# Patient Record
Sex: Male | Born: 1940 | Race: White | Hispanic: No | Marital: Married | Smoking: Former smoker
Health system: Southern US, Community
[De-identification: ages and names within clinical notes are randomized; demographics above are authoritative.]

## PROBLEM LIST (undated history)

## (undated) DIAGNOSIS — G459 Transient cerebral ischemic attack, unspecified: Secondary | ICD-10-CM

## (undated) DIAGNOSIS — I10 Essential (primary) hypertension: Secondary | ICD-10-CM

## (undated) DIAGNOSIS — F329 Major depressive disorder, single episode, unspecified: Secondary | ICD-10-CM

## (undated) DIAGNOSIS — I2510 Atherosclerotic heart disease of native coronary artery without angina pectoris: Secondary | ICD-10-CM

## (undated) DIAGNOSIS — F32A Depression, unspecified: Secondary | ICD-10-CM

## (undated) DIAGNOSIS — J449 Chronic obstructive pulmonary disease, unspecified: Secondary | ICD-10-CM

## (undated) DIAGNOSIS — E785 Hyperlipidemia, unspecified: Secondary | ICD-10-CM

## (undated) HISTORY — DX: Major depressive disorder, single episode, unspecified: F32.9

## (undated) HISTORY — DX: Essential (primary) hypertension: I10

## (undated) HISTORY — DX: Chronic obstructive pulmonary disease, unspecified: J44.9

## (undated) HISTORY — DX: Atherosclerotic heart disease of native coronary artery without angina pectoris: I25.10

## (undated) HISTORY — DX: Hyperlipidemia, unspecified: E78.5

## (undated) HISTORY — DX: Depression, unspecified: F32.A

## (undated) HISTORY — PX: CHOLECYSTECTOMY: SHX55

## (undated) HISTORY — DX: Transient cerebral ischemic attack, unspecified: G45.9

---

## 2001-10-25 ENCOUNTER — Encounter: Admission: RE | Admit: 2001-10-25 | Discharge: 2001-10-25 | Payer: Self-pay | Admitting: Neurosurgery

## 2001-10-25 ENCOUNTER — Encounter: Payer: Self-pay | Admitting: Neurosurgery

## 2001-10-28 ENCOUNTER — Encounter: Payer: Self-pay | Admitting: Neurosurgery

## 2001-10-28 ENCOUNTER — Encounter: Admission: RE | Admit: 2001-10-28 | Discharge: 2001-10-28 | Payer: Self-pay | Admitting: Neurosurgery

## 2001-11-15 ENCOUNTER — Encounter: Payer: Self-pay | Admitting: Neurosurgery

## 2001-11-16 ENCOUNTER — Encounter: Payer: Self-pay | Admitting: Neurosurgery

## 2001-11-16 ENCOUNTER — Ambulatory Visit (HOSPITAL_COMMUNITY): Admission: RE | Admit: 2001-11-16 | Discharge: 2001-11-17 | Payer: Self-pay | Admitting: Neurosurgery

## 2001-12-09 ENCOUNTER — Encounter: Payer: Self-pay | Admitting: Neurosurgery

## 2001-12-09 ENCOUNTER — Ambulatory Visit (HOSPITAL_COMMUNITY): Admission: RE | Admit: 2001-12-09 | Discharge: 2001-12-09 | Payer: Self-pay | Admitting: Neurosurgery

## 2006-06-02 ENCOUNTER — Emergency Department: Payer: Self-pay | Admitting: Emergency Medicine

## 2006-06-02 ENCOUNTER — Other Ambulatory Visit: Payer: Self-pay

## 2008-05-08 ENCOUNTER — Encounter: Payer: Self-pay | Admitting: Family Medicine

## 2009-12-08 DIAGNOSIS — G459 Transient cerebral ischemic attack, unspecified: Secondary | ICD-10-CM

## 2009-12-08 HISTORY — DX: Transient cerebral ischemic attack, unspecified: G45.9

## 2010-05-10 ENCOUNTER — Emergency Department: Payer: Self-pay | Admitting: Emergency Medicine

## 2011-02-17 ENCOUNTER — Emergency Department: Payer: Self-pay | Admitting: Emergency Medicine

## 2011-03-12 ENCOUNTER — Ambulatory Visit: Payer: Self-pay | Admitting: Pain Medicine

## 2011-04-02 ENCOUNTER — Ambulatory Visit: Payer: Self-pay | Admitting: Pain Medicine

## 2011-04-17 ENCOUNTER — Ambulatory Visit: Payer: Self-pay | Admitting: Pain Medicine

## 2011-11-15 ENCOUNTER — Inpatient Hospital Stay: Payer: Self-pay | Admitting: Internal Medicine

## 2013-03-26 IMAGING — CT CT CHEST W/O CM
1 series · 15 of 31 positions shown, 19 images · non-contrast
Comparison: none

REASON FOR EXAM: dyspnea  chest pain
COMMENTS:

[Series 2: soft tissue · axial · 0.73mm/px · z∈[-192,+108]mm · 15 of 66 slices shown, 19 images]
[im 3/66  mediastinal]
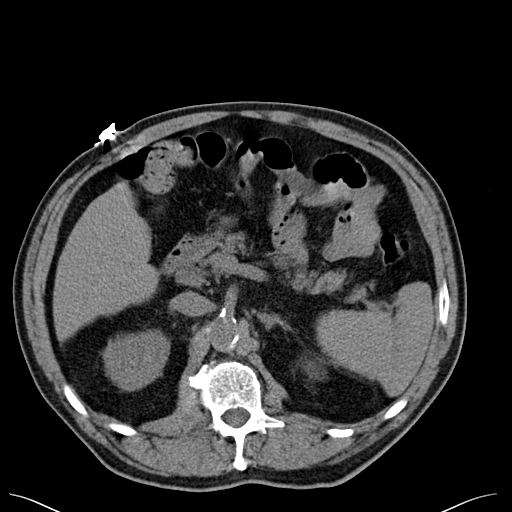
[im 3/66  lung]
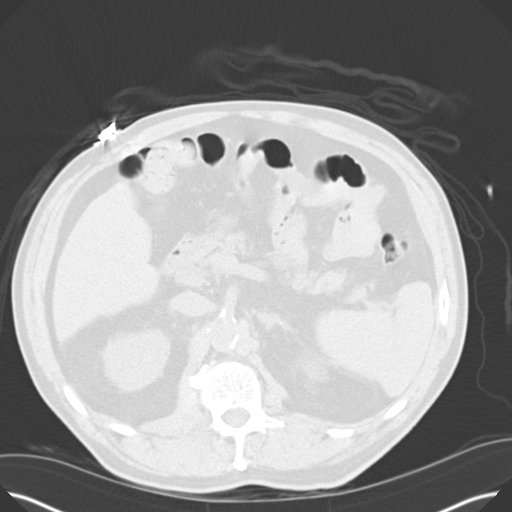
[im 8/66  lung]
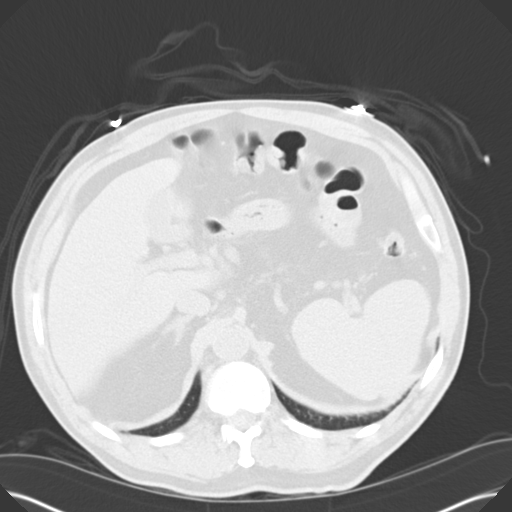
[im 13/66  lung]
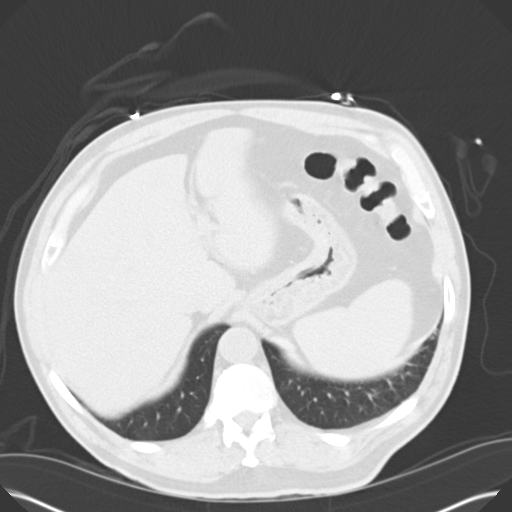
[im 15/66  lung]
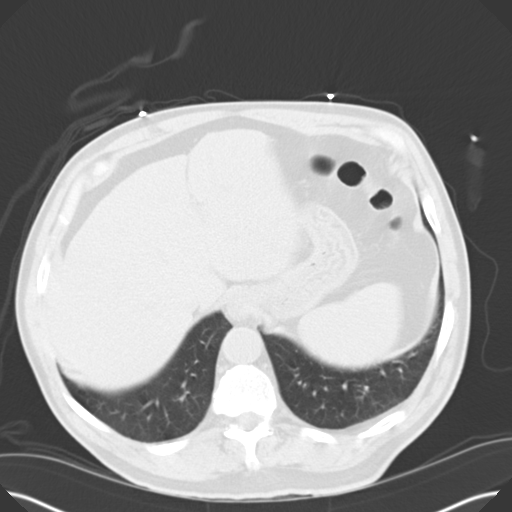
[im 20/66  mediastinal]
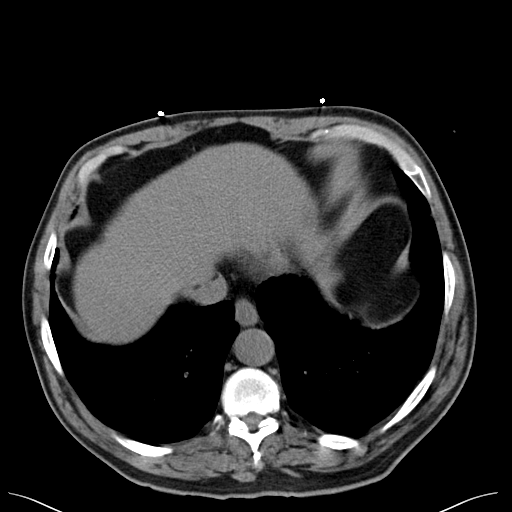
[im 20/66  lung]
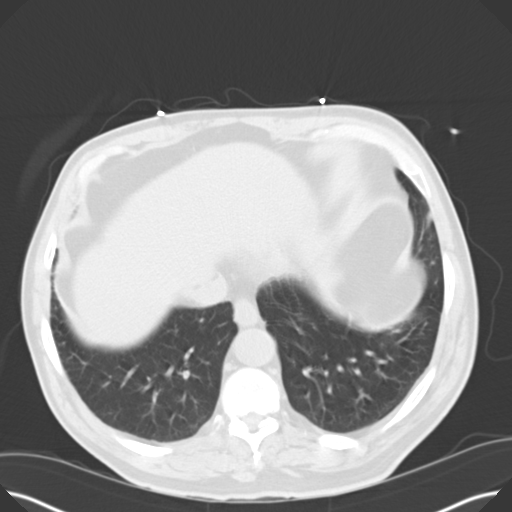
[im 25/66  lung]
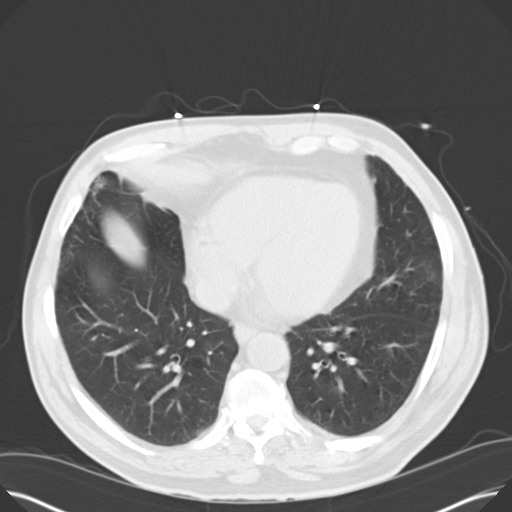
[im 29/66  lung]
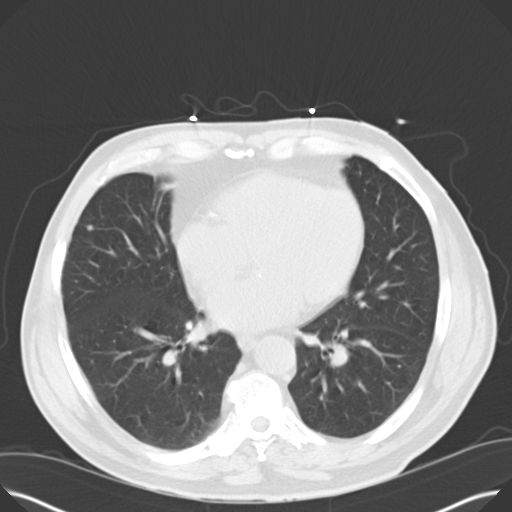
[im 34/66  lung]
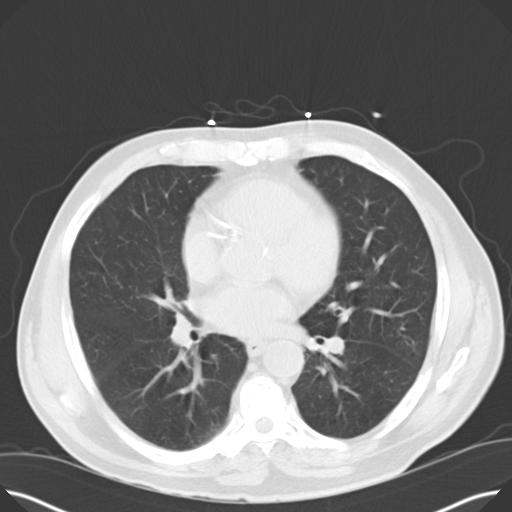
[im 37/66  mediastinal]
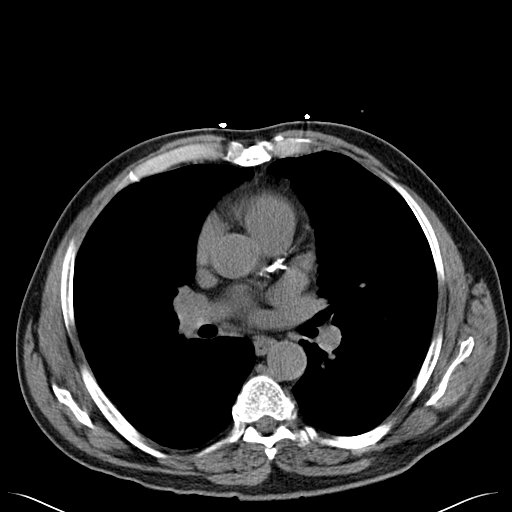
[im 37/66  lung]
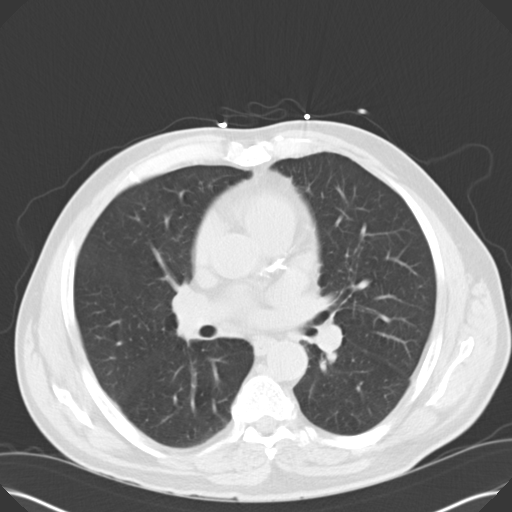
[im 40/66  lung]
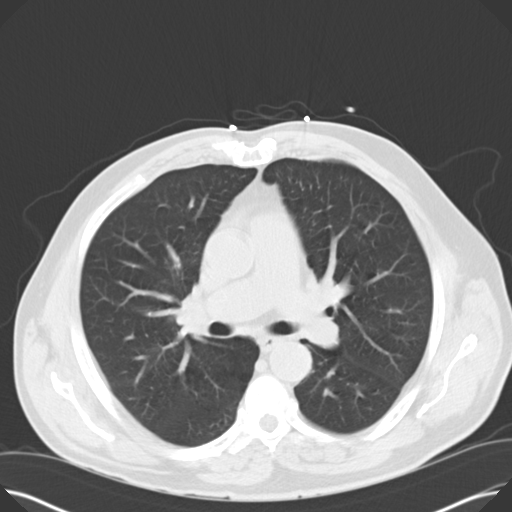
[im 44/66  lung]
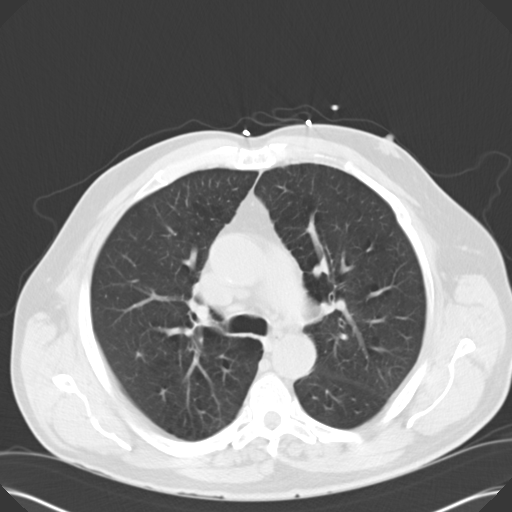
[im 49/66  lung]
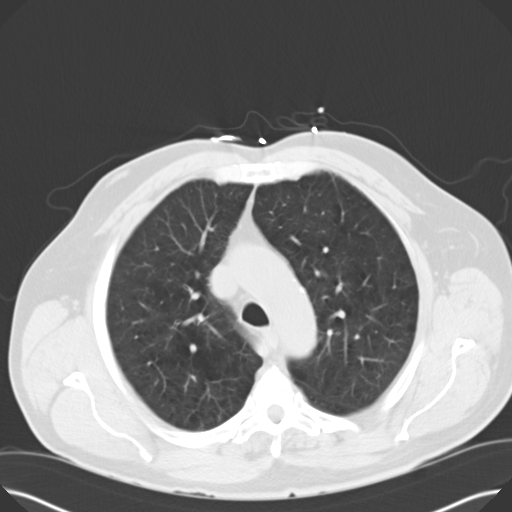
[im 53/66  mediastinal]
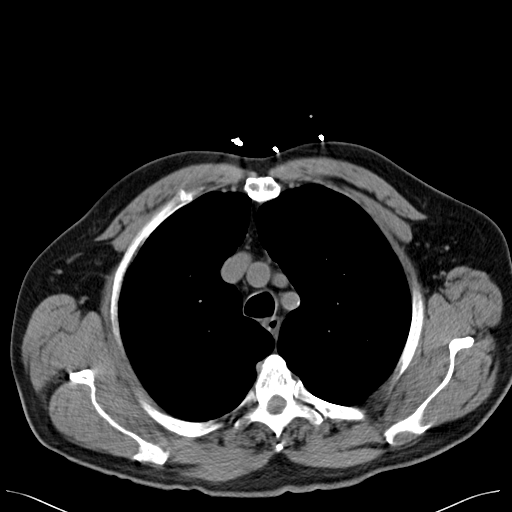
[im 53/66  lung]
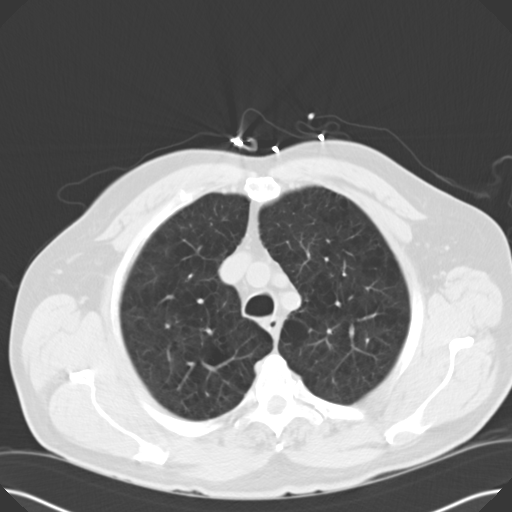
[im 58/66  lung]
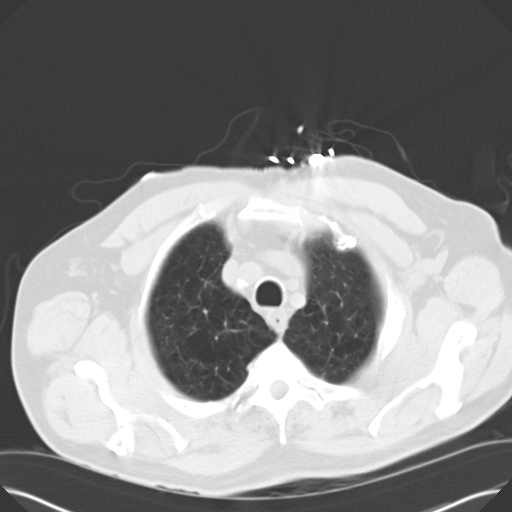
[im 63/66  lung]
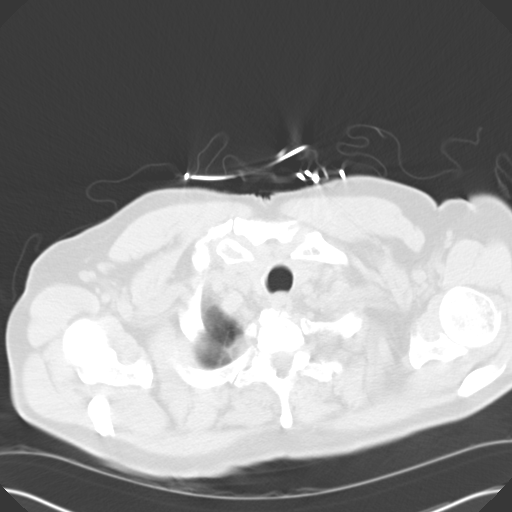

[15 of 31 positions shown; findings below may reference images not displayed]

PROCEDURE:     CT  - CT CHEST WITHOUT CONTRAST  - November 15, 2011  [DATE]

RESULT:     Axial noncontrast CT scanning was performed through the chest
with reconstructions at 5 mm intervals and slice thicknesses. Review of
multiplanar reconstructed images was performed separately on the VIA monitor.

The CAD images exhibit emphysematous changes bilaterally. I do not see
evidence of pneumonia. There are scattered nodules in both lungs. The
largest measures 4 mm in diameter is seen on image 38 in the anterolateral
aspect of the right middle lobe. The cardiac chambers are normal in size.
The caliber of the thoracic aorta is normal. I see no pathologic sized or
hilar lymph nodes. There is no pleural nor pericardial effusion. Within the
upper abdomen the observed portions of the liver and spleen and adrenal
glands are normal. The thoracic vertebral bodies are preserved in height.
IMPRESSION: 1. There are emphysematous changes in both lungs. There are numerous tiny
nodules which are nonspecific but which will merit followup. There is no
evidence of pneumonia.
2. I see no mediastinal nor hilar lymphadenopathy.

A preliminary report was sent to the [HOSPITAL] the conclusion
of the study.

## 2013-03-27 IMAGING — US ABDOMEN ULTRASOUND
1 series · 18 of 25 positions shown · non-contrast
Comparison: none

REASON FOR EXAM: Neutropenia/Thrombocytopenia
COMMENTS:

PROCEDURE:     US  - US ABDOMEN GENERAL SURVEY  - November 16, 2011 [DATE]
RESULT:     Liver normal. Pancreas normal where visualized. Gallbladder
normal. Gallbladder wall thickness 2.1 mm. Common bile duct diameter 3 mm.
No hydronephrosis. No splenomegaly.

[Series 1: abdomen ultrasound · 18 of 86 slices shown]
[im 1/86]
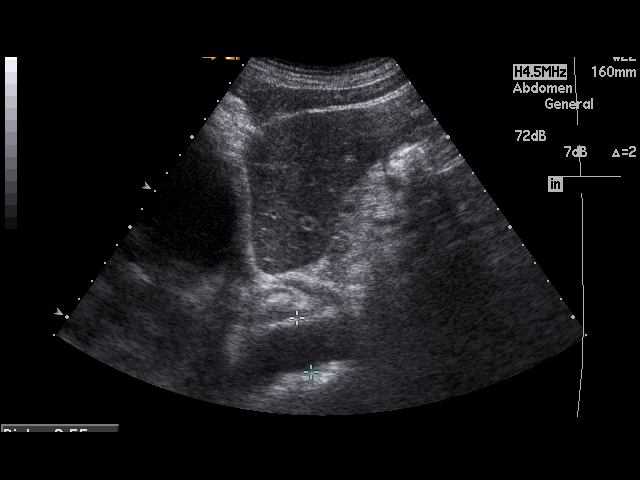
[im 8/86]
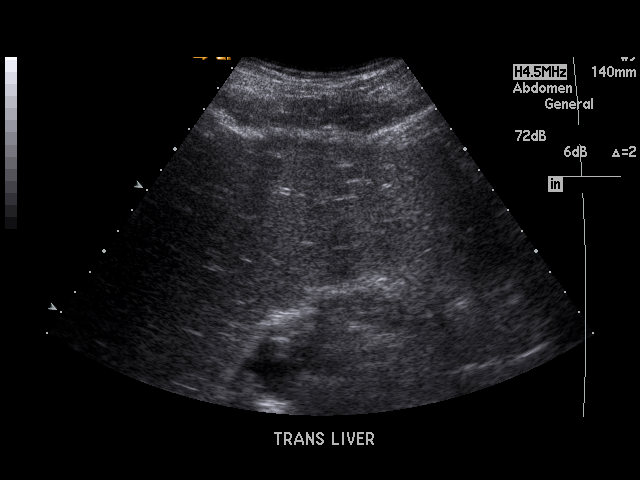
[im 11/86]
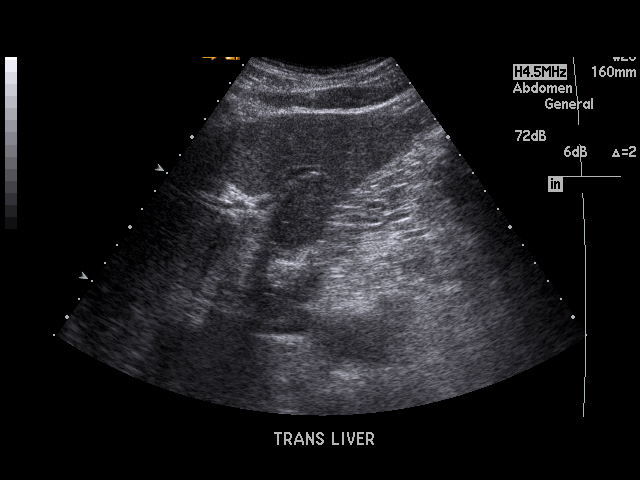
[im 15/86]
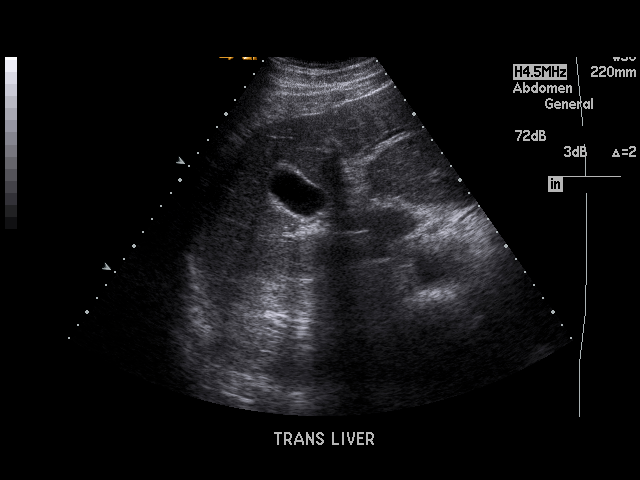
[im 22/86]
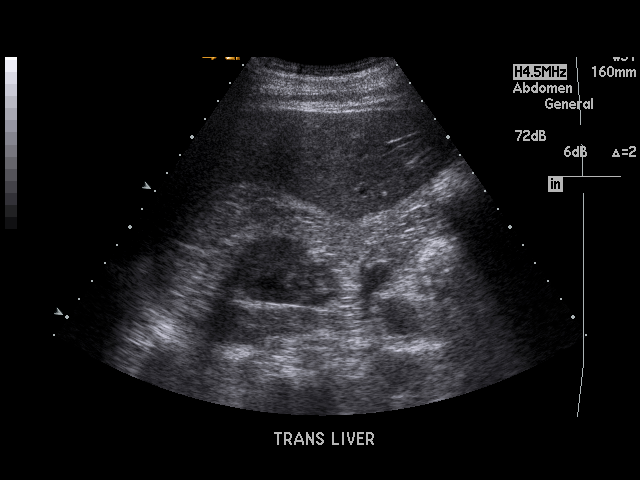
[im 25/86]
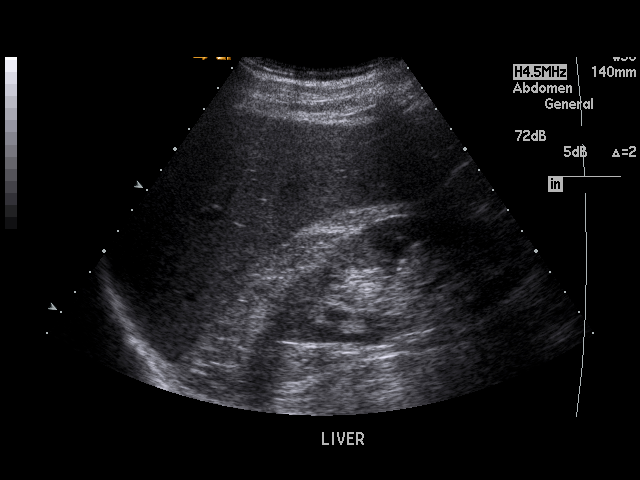
[im 32/86]
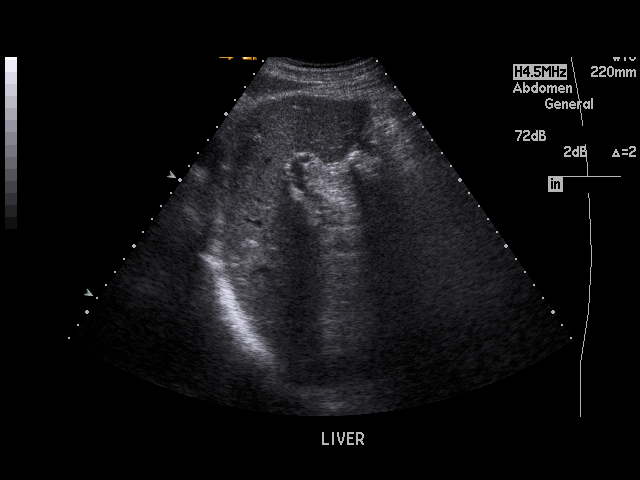
[im 36/86]
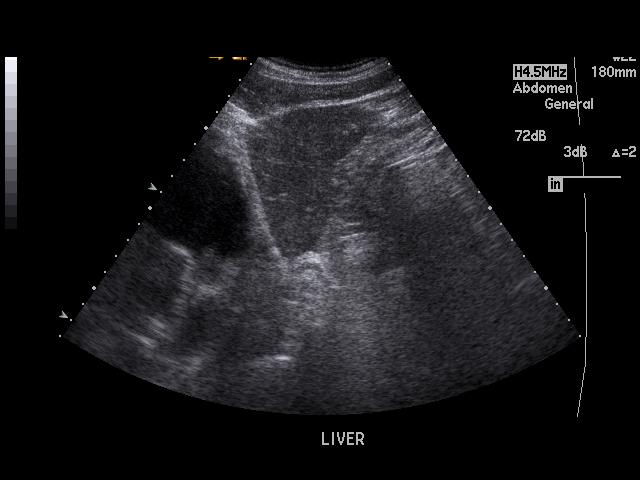
[im 39/86]
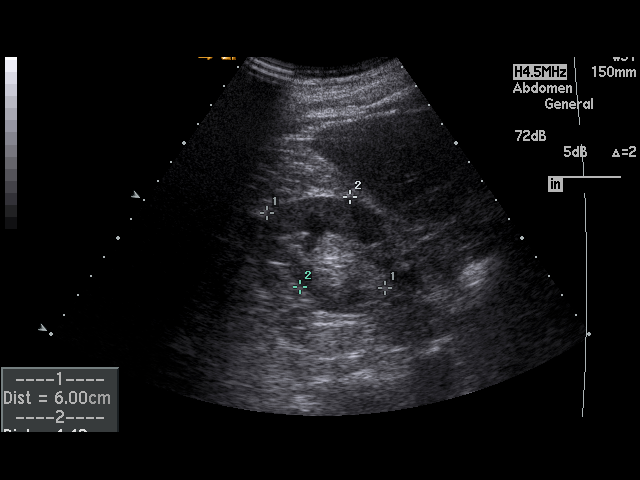
[im 47/86]
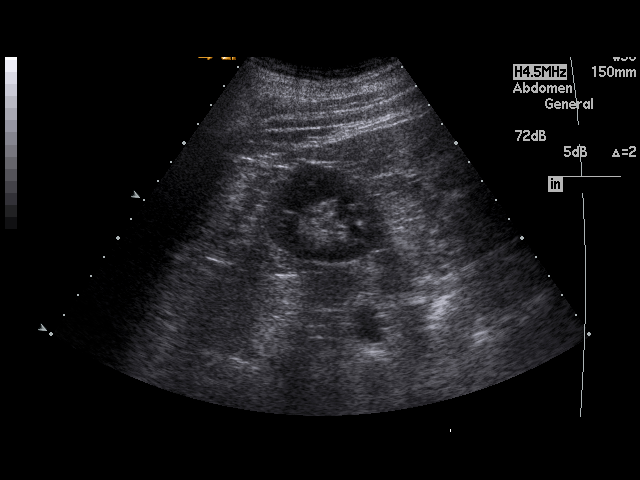
[im 50/86]
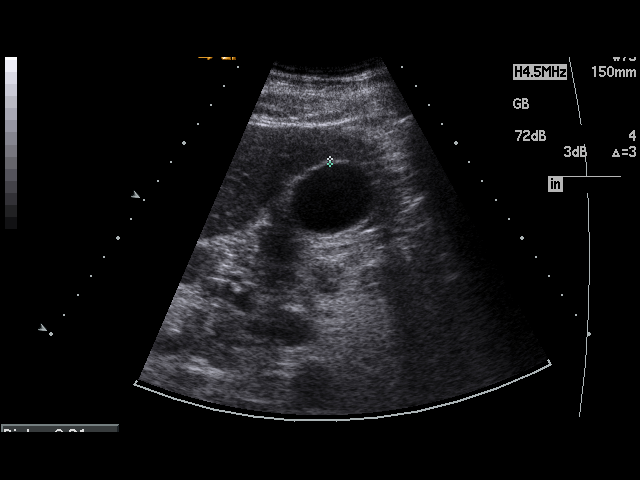
[im 54/86]
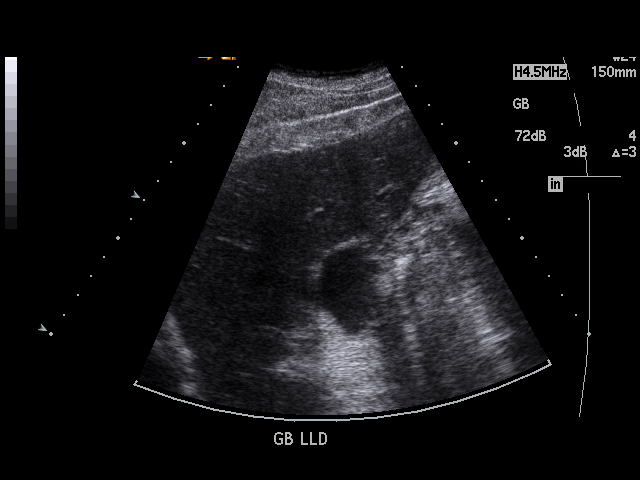
[im 61/86]
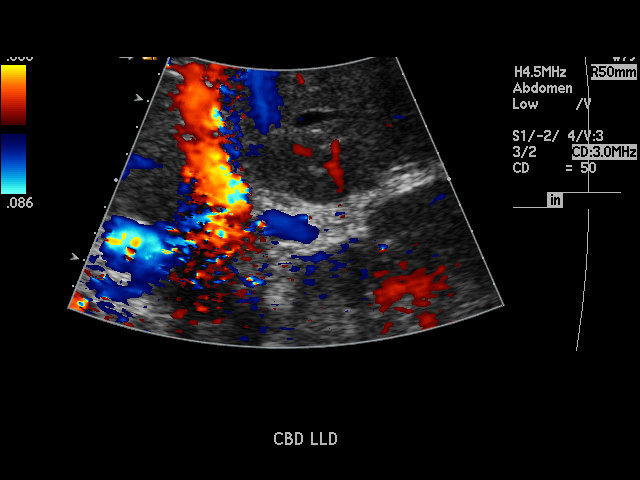
[im 64/86]
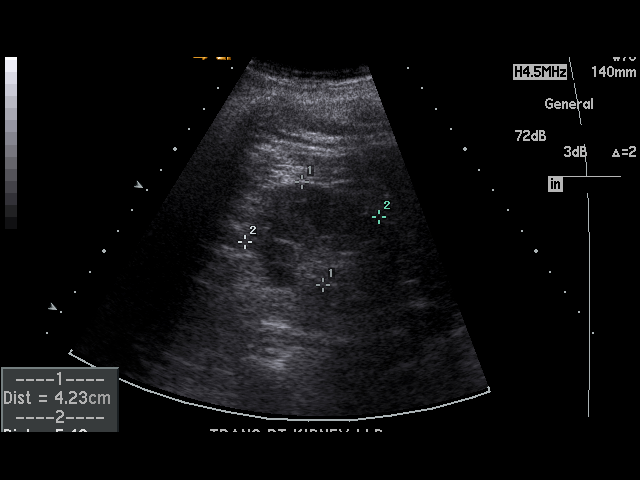
[im 71/86]
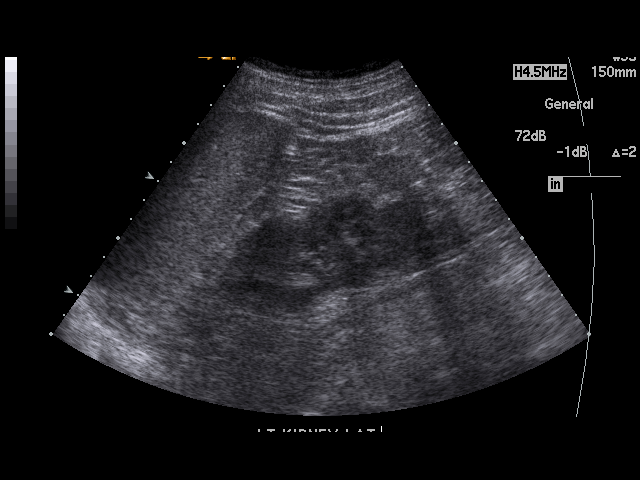
[im 75/86]
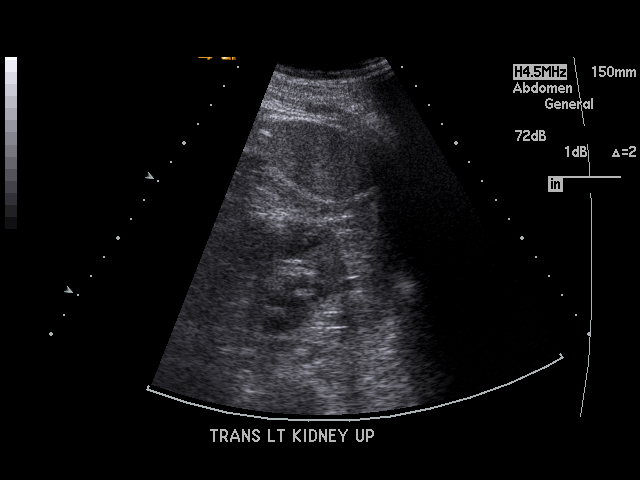
[im 78/86]
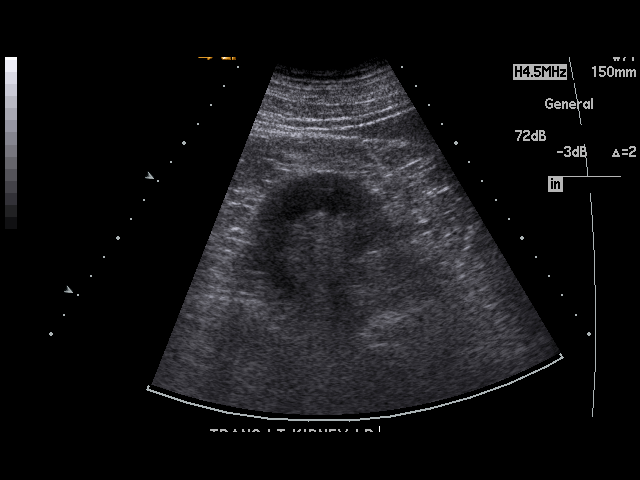
[im 86/86]
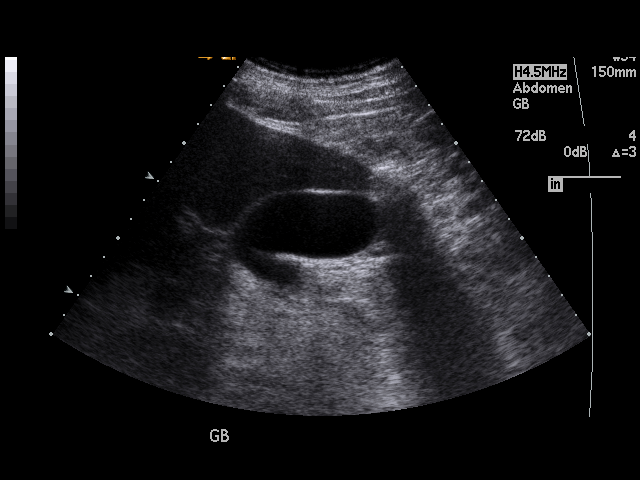

[18 of 25 positions shown; findings below may reference images not displayed]

IMPRESSION: Normal exam.

## 2015-05-22 ENCOUNTER — Encounter (INDEPENDENT_AMBULATORY_CARE_PROVIDER_SITE_OTHER): Payer: Self-pay | Admitting: Ophthalmology

## 2015-05-23 ENCOUNTER — Encounter (INDEPENDENT_AMBULATORY_CARE_PROVIDER_SITE_OTHER): Payer: Medicare Other | Admitting: Ophthalmology

## 2015-05-23 DIAGNOSIS — I10 Essential (primary) hypertension: Secondary | ICD-10-CM

## 2015-05-23 DIAGNOSIS — H43813 Vitreous degeneration, bilateral: Secondary | ICD-10-CM

## 2015-05-23 DIAGNOSIS — H3531 Nonexudative age-related macular degeneration: Secondary | ICD-10-CM | POA: Diagnosis not present

## 2015-05-23 DIAGNOSIS — H35033 Hypertensive retinopathy, bilateral: Secondary | ICD-10-CM

## 2015-05-27 ENCOUNTER — Other Ambulatory Visit: Payer: Self-pay | Admitting: Family Medicine

## 2015-05-28 ENCOUNTER — Telehealth: Payer: Self-pay | Admitting: Family Medicine

## 2015-05-28 NOTE — Telephone Encounter (Signed)
MAC has written Rx, faxed to [facility]

## 2015-05-28 NOTE — Telephone Encounter (Signed)
Pt called stated he needs on Lorazepam. Pharm is [facility]. Thanks.

## 2015-06-25 ENCOUNTER — Encounter: Payer: Self-pay | Admitting: Family Medicine

## 2015-06-25 ENCOUNTER — Ambulatory Visit (INDEPENDENT_AMBULATORY_CARE_PROVIDER_SITE_OTHER): Payer: Medicare Other | Admitting: Family Medicine

## 2015-06-25 VITALS — BP 148/63 | HR 50 | Temp 98.0°F | Ht 68.0 in | Wt 165.0 lb

## 2015-06-25 DIAGNOSIS — I10 Essential (primary) hypertension: Secondary | ICD-10-CM | POA: Diagnosis not present

## 2015-06-25 DIAGNOSIS — I2510 Atherosclerotic heart disease of native coronary artery without angina pectoris: Secondary | ICD-10-CM | POA: Diagnosis not present

## 2015-06-25 DIAGNOSIS — E785 Hyperlipidemia, unspecified: Secondary | ICD-10-CM

## 2015-06-25 DIAGNOSIS — Z0000 Encounter for general adult medical examination without abnormal findings: Secondary | ICD-10-CM

## 2015-06-25 DIAGNOSIS — M1712 Unilateral primary osteoarthritis, left knee: Secondary | ICD-10-CM | POA: Diagnosis not present

## 2015-06-25 DIAGNOSIS — F329 Major depressive disorder, single episode, unspecified: Secondary | ICD-10-CM

## 2015-06-25 DIAGNOSIS — I2583 Coronary atherosclerosis due to lipid rich plaque: Secondary | ICD-10-CM

## 2015-06-25 DIAGNOSIS — Z125 Encounter for screening for malignant neoplasm of prostate: Secondary | ICD-10-CM

## 2015-06-25 DIAGNOSIS — F32A Depression, unspecified: Secondary | ICD-10-CM

## 2015-06-25 LAB — URINALYSIS, ROUTINE W REFLEX MICROSCOPIC
Bilirubin, UA: NEGATIVE
Glucose, UA: NEGATIVE
KETONES UA: NEGATIVE
Leukocytes, UA: NEGATIVE
NITRITE UA: NEGATIVE
RBC UA: NEGATIVE
Specific Gravity, UA: 1.025 (ref 1.005–1.030)
Urobilinogen, Ur: 0.2 mg/dL (ref 0.2–1.0)
pH, UA: 5 (ref 5.0–7.5)

## 2015-06-25 MED ORDER — TRAZODONE HCL 50 MG PO TABS: 50.0000 mg | ORAL_TABLET | Freq: Every day | ORAL | Status: DC

## 2015-06-25 MED ORDER — LORAZEPAM 1 MG PO TABS: 1.0000 mg | ORAL_TABLET | Freq: Every day | ORAL | Status: DC | PRN

## 2015-06-25 MED ORDER — CITALOPRAM HYDROBROMIDE 40 MG PO TABS: 40.0000 mg | ORAL_TABLET | Freq: Every day | ORAL | Status: DC

## 2015-06-25 MED ORDER — SIMVASTATIN 80 MG PO TABS: 80.0000 mg | ORAL_TABLET | Freq: Every day | ORAL | Status: DC

## 2015-06-25 NOTE — Progress Notes (Signed)
BP 148/63 mmHg  Pulse 50  Temp(Src) 98 F (36.7 C)  Ht 5\' 8"  (1.727 m)  Wt 165 lb (74.844 kg)  BMI 25.09 kg/m2  SpO2 95%   Subjective:    Patient ID: Andre JacksJimmy L Goosby, male    DOB: Aug 16, 1941, 74 y.o.   MRN: [id number]  HPI: Andre Griffin is a 74 y.o. male  Chief Complaint  Patient presents with  . Annual Exam   patient taken citalopram and lorazepam at night for nerves which is doing well. Does feel tired though but no hangover no falling Does well with simvastatin for cholesterol with no side effects takes everyday Takes trazodone for sleep and does well Was on Advil for his knee pain along with tramadol kidney doctor stopped nonsteroidal anti-inflammatory medicines and now uses tramadol for knee pain.  Relevant past medical, surgical, family and social history reviewed and updated as indicated. Interim medical history since our last visit reviewed. Allergies and medications reviewed and updated.  Review of Systems  Constitutional: Negative.   HENT: Negative.   Eyes: Negative.   Respiratory: Negative.   Cardiovascular: Negative.   Endocrine: Negative.   Musculoskeletal: Negative.   Skin: Negative.   Allergic/Immunologic: Negative.   Neurological: Negative.   Hematological: Negative.   Psychiatric/Behavioral: Negative.     Per HPI unless specifically indicated above     Objective:    BP 148/63 mmHg  Pulse 50  Temp(Src) 98 F (36.7 C)  Ht 5\' 8"  (1.727 m)  Wt 165 lb (74.844 kg)  BMI 25.09 kg/m2  SpO2 95%  Wt Readings from Last 3 Encounters:  06/25/15 165 lb (74.844 kg)  12/21/14 167 lb (75.751 kg)    Physical Exam  Constitutional: He is oriented to person, place, and time. He appears well-developed and well-nourished.  HENT:  Head: Normocephalic and atraumatic.  Right Ear: External ear normal.  Left Ear: External ear normal.  Eyes: Conjunctivae and EOM are normal. Pupils are equal, round, and reactive to light.  Neck: Normal range of motion. Neck  supple.  Cardiovascular: Normal rate, regular rhythm, normal heart sounds and intact distal pulses.   Pulmonary/Chest: Effort normal and breath sounds normal.  Abdominal: Soft. Bowel sounds are normal. There is no splenomegaly or hepatomegaly.  Genitourinary: Rectum normal, prostate normal and penis normal.  Musculoskeletal: Normal range of motion.  Neurological: He is alert and oriented to person, place, and time. He has normal reflexes.  Skin: No rash noted. No erythema.  Psychiatric: He has a normal mood and affect. His behavior is normal. Judgment and thought content normal.    No results found for this or any previous visit.    Assessment & Plan:   Problem List Items Addressed This Visit      Cardiovascular and Mediastinum   Hypertension - Primary   Relevant Medications   simvastatin (ZOCOR) 80 MG tablet   Other Relevant Orders   Comprehensive metabolic panel   CBC with Differential/Platelet   Urinalysis, Routine w reflex microscopic ([facility])   TSH   PSA   Lipid panel   CAD (coronary artery disease)   Relevant Medications   simvastatin (ZOCOR) 80 MG tablet   Other Relevant Orders   Comprehensive metabolic panel   CBC with Differential/Platelet   Urinalysis, Routine w reflex microscopic ([facility])   TSH   PSA   Lipid panel     Musculoskeletal and Integument   Left knee DJD   Relevant Orders   Comprehensive metabolic  panel   CBC with Differential/Platelet   Urinalysis, Routine w reflex microscopic (not at [facility])   TSH   PSA   Lipid panel     Other   Depression   Relevant Medications   traZODone (DESYREL) 50 MG tablet   LORazepam (ATIVAN) 1 MG tablet   citalopram (CELEXA) 40 MG tablet   Other Relevant Orders   Comprehensive metabolic panel   CBC with Differential/Platelet   Urinalysis, Routine w reflex microscopic (not at [facility])   TSH   PSA   Lipid panel   Hyperlipidemia   Relevant Medications   simvastatin (ZOCOR) 80 MG tablet   Other Relevant  Orders   Comprehensive metabolic panel   CBC with Differential/Platelet   Urinalysis, Routine w reflex microscopic (not at [facility])   TSH   PSA   Lipid panel    Other Visit Diagnoses    PE (physical exam), annual        Relevant Orders    Comprehensive metabolic panel    CBC with Differential/Platelet    Urinalysis, Routine w reflex microscopic (not at [facility])    TSH    PSA    Lipid panel        Follow up plan: Return in about 6 months (around 12/26/2015), or if symptoms worsen or fail to improve, for recheck lipids, alt, ast,bmp.

## 2015-06-26 LAB — COMPREHENSIVE METABOLIC PANEL
ALBUMIN: 4 g/dL (ref 3.5–4.8)
ALT: 17 IU/L (ref 0–44)
AST: 20 IU/L (ref 0–40)
Albumin/Globulin Ratio: 1.4 (ref 1.1–2.5)
Alkaline Phosphatase: 80 IU/L (ref 39–117)
BILIRUBIN TOTAL: 0.3 mg/dL (ref 0.0–1.2)
BUN/Creatinine Ratio: 9 — ABNORMAL LOW (ref 10–22)
BUN: 13 mg/dL (ref 8–27)
CO2: 23 mmol/L (ref 18–29)
Calcium: 8.8 mg/dL (ref 8.6–10.2)
Chloride: 105 mmol/L (ref 97–108)
Creatinine, Ser: 1.49 mg/dL — ABNORMAL HIGH (ref 0.76–1.27)
GFR calc non Af Amer: 46 mL/min/{1.73_m2} — ABNORMAL LOW (ref >59)
GFR, EST AFRICAN AMERICAN: 53 mL/min/{1.73_m2} — AB (ref >59)
GLOBULIN, TOTAL: 2.9 g/dL (ref 1.5–4.5)
Glucose: 83 mg/dL (ref 65–99)
Potassium: 3.9 mmol/L (ref 3.5–5.2)
Sodium: 145 mmol/L — ABNORMAL HIGH (ref 134–144)
Total Protein: 6.9 g/dL (ref 6.0–8.5)

## 2015-06-26 LAB — CBC WITH DIFFERENTIAL/PLATELET
Basophils Absolute: 0 10*3/uL (ref 0.0–0.2)
Basos: 0 %
EOS (ABSOLUTE): 0.2 10*3/uL (ref 0.0–0.4)
Eos: 2 %
HEMATOCRIT: 40.2 % (ref 37.5–51.0)
HEMOGLOBIN: 13.5 g/dL (ref 12.6–17.7)
Immature Grans (Abs): 0 10*3/uL (ref 0.0–0.1)
Immature Granulocytes: 0 %
Lymphocytes Absolute: 1.6 10*3/uL (ref 0.7–3.1)
Lymphs: 23 %
MCH: 31.1 pg (ref 26.6–33.0)
MCHC: 33.6 g/dL (ref 31.5–35.7)
MCV: 93 fL (ref 79–97)
MONOCYTES: 12 %
MONOS ABS: 0.8 10*3/uL (ref 0.1–0.9)
Neutrophils Absolute: 4.3 10*3/uL (ref 1.4–7.0)
Neutrophils: 63 %
Platelets: 168 10*3/uL (ref 150–379)
RBC: 4.34 x10E6/uL (ref 4.14–5.80)
RDW: 14.5 % (ref 12.3–15.4)
WBC: 6.9 10*3/uL (ref 3.4–10.8)

## 2015-06-26 LAB — LIPID PANEL
Chol/HDL Ratio: 5.2 ratio units — ABNORMAL HIGH (ref 0.0–5.0)
Cholesterol, Total: 161 mg/dL (ref 100–199)
HDL: 31 mg/dL — ABNORMAL LOW (ref >39)
LDL Calculated: 98 mg/dL (ref 0–99)
Triglycerides: 159 mg/dL — ABNORMAL HIGH (ref 0–149)
VLDL CHOLESTEROL CAL: 32 mg/dL (ref 5–40)

## 2015-06-26 LAB — PSA: Prostate Specific Ag, Serum: 2.7 ng/mL (ref 0.0–4.0)

## 2015-06-26 LAB — TSH: TSH: 1.78 u[IU]/mL (ref 0.450–4.500)

## 2015-06-27 ENCOUNTER — Other Ambulatory Visit: Payer: Self-pay | Admitting: Family Medicine

## 2015-06-28 NOTE — Telephone Encounter (Signed)
Called into [address] 

## 2015-07-24 ENCOUNTER — Telehealth: Payer: Self-pay

## 2015-07-24 ENCOUNTER — Telehealth: Payer: Self-pay | Admitting: Family Medicine

## 2015-07-24 DIAGNOSIS — F329 Major depressive disorder, single episode, unspecified: Secondary | ICD-10-CM

## 2015-07-24 DIAGNOSIS — F32A Depression, unspecified: Secondary | ICD-10-CM

## 2015-07-24 MED ORDER — LORAZEPAM 1 MG PO TABS: 1.0000 mg | ORAL_TABLET | Freq: Every day | ORAL | Status: DC | PRN

## 2015-07-24 NOTE — Telephone Encounter (Signed)
Lorazepam should be written BID - per MAC Change given to [facility]

## 2015-07-24 NOTE — Telephone Encounter (Signed)
Lorazepam has historically been written BID #60 He is running out, could you send new Rx

## 2015-07-24 NOTE — Telephone Encounter (Signed)
Paige from [place] called has questions about pt's Lorazepam. Please call Paige. Thanks.

## 2015-08-23 ENCOUNTER — Encounter: Payer: Self-pay | Admitting: Family Medicine

## 2015-08-23 ENCOUNTER — Ambulatory Visit (INDEPENDENT_AMBULATORY_CARE_PROVIDER_SITE_OTHER): Payer: Medicare Other | Admitting: Family Medicine

## 2015-08-23 VITALS — BP 164/95 | HR 52 | Temp 97.1°F | Wt 170.0 lb

## 2015-08-23 DIAGNOSIS — R42 Dizziness and giddiness: Secondary | ICD-10-CM

## 2015-08-23 DIAGNOSIS — R001 Bradycardia, unspecified: Secondary | ICD-10-CM

## 2015-08-23 DIAGNOSIS — Z23 Encounter for immunization: Secondary | ICD-10-CM | POA: Diagnosis not present

## 2015-08-23 NOTE — Progress Notes (Signed)
BP 164/95 mmHg  Pulse 52  Temp(Src) 97.1 F (36.2 C)  Wt 170 lb (77.111 kg)  SpO2 93%   Subjective:    Patient ID: Andre Griffin, male    DOB: [birth year], 74 y.o.   MRN: [id number]  HPI: Andre Griffin is a 74 y.o. male  Chief Complaint  Patient presents with  . Numbness    left arm numbness, swimmy headed and he thinks that he has blacked out a couple times. Patient states that the only other symptom he is having is a lot of gas.   Has a history of CAD.  Started prior to Tuesday, has been passing out, and having L arm numbness. No chest pain, no SOB, has been feeling dizzy. Recently had his blood pressure medicine stopped by his nephrologist.  DIZZINESS Duration: days Description of symptoms: lightheaded Duration of episode: minutes Dizziness frequency: recurrent Provoking factors: sitting up Aggravating factors:  Moving around Triggered by rolling over in bed: no Triggered by bending over: yes Aggravated by head movement: no Aggravated by exertion, coughing, loud noises: no Recent head injury: no Recent or current viral symptoms: no History of vasovagal episodes: no Nausea: yes Vomiting: no Tinnitus: no Hearing loss: no Aural fullness: no Headache: no Photophobia/phonophobia: no Unsteady gait: yes Postural instability: yes Diplopia, dysarthria, dysphagia or weakness: no Related to exertion: no Pallor: yes Diaphoresis: no Dyspnea: no Chest pain: no  Relevant past medical, surgical, family and social history reviewed and updated as indicated. Interim medical history since our last visit reviewed. Allergies and medications reviewed and updated.  Review of Systems  Constitutional: Positive for fatigue. Negative for fever, chills, diaphoresis, activity change, appetite change and unexpected weight change.  Respiratory: Negative.   Cardiovascular: Negative.   Neurological: Positive for dizziness, syncope, weakness, light-headedness and numbness. Negative for  tremors, seizures, facial asymmetry, speech difficulty and headaches.    Per HPI unless specifically indicated above     Objective:    BP 164/95 mmHg  Pulse 52  Temp(Src) 97.1 F (36.2 C)  Wt 170 lb (77.111 kg)  SpO2 93%  Wt Readings from Last 3 Encounters:  08/23/15 170 lb (77.111 kg)  06/25/15 165 lb (74.844 kg)  12/21/14 167 lb (75.751 kg)    Physical Exam  Constitutional: He is oriented to person, place, and time. He appears well-developed and well-nourished. No distress.  HENT:  Head: Normocephalic and atraumatic.  Right Ear: Hearing normal.  Left Ear: Hearing normal.  Nose: Nose normal.  Eyes: Conjunctivae and lids are normal. Right eye exhibits no discharge. Left eye exhibits no discharge. No scleral icterus.  Cardiovascular: Regular rhythm, normal heart sounds and intact distal pulses.  Exam reveals no gallop and no friction rub.   No murmur heard. Pulmonary/Chest: Effort normal. No respiratory distress. He has no wheezes. He has no rales. He exhibits no tenderness.  Musculoskeletal: Normal range of motion.  Neurological: He is alert and oriented to person, place, and time.  Skin: Skin is intact. No rash noted. There is pallor.  Clammy and cold  Psychiatric: He has a normal mood and affect. His speech is normal and behavior is normal. Judgment and thought content normal. Cognition and memory are normal.  Nursing note and vitals reviewed.   Results for orders placed or performed in visit on 06/25/15  Comprehensive metabolic panel  Result Value Ref Range   Glucose 83 65 - 99 mg/dL   BUN 13 8 - 27 mg/dL   Creatinine, Ser 4.09 (H) 0.76 -  1.27 mg/dL   GFR calc non Af Amer 46 (L) >59 mL/min/1.73   GFR calc Af Amer 53 (L) >59 mL/min/1.73   BUN/Creatinine Ratio 9 (L) 10 - 22   Sodium 145 (H) 134 - 144 mmol/L   Potassium 3.9 3.5 - 5.2 mmol/L   Chloride 105 97 - 108 mmol/L   CO2 23 18 - 29 mmol/L   Calcium 8.8 8.6 - 10.2 mg/dL   Total Protein 6.9 6.0 - 8.5 g/dL    Albumin 4.0 3.5 - 4.8 g/dL   Globulin, Total 2.9 1.5 - 4.5 g/dL   Albumin/Globulin Ratio 1.4 1.1 - 2.5   Bilirubin Total 0.3 0.0 - 1.2 mg/dL   Alkaline Phosphatase 80 39 - 117 IU/L   AST 20 0 - 40 IU/L   ALT 17 0 - 44 IU/L  CBC with Differential/Platelet  Result Value Ref Range   WBC 6.9 3.4 - 10.8 x10E3/uL   RBC 4.34 4.14 - 5.80 x10E6/uL   Hemoglobin 13.5 12.6 - 17.7 g/dL   Hematocrit 16.1 09.6 - 51.0 %   MCV 93 79 - 97 fL   MCH 31.1 26.6 - 33.0 pg   MCHC 33.6 31.5 - 35.7 g/dL   RDW 04.5 40.9 - 81.1 %   Platelets 168 150 - 379 x10E3/uL   Neutrophils 63 %   Lymphs 23 %   Monocytes 12 %   Eos 2 %   Basos 0 %   Neutrophils Absolute 4.3 1.4 - 7.0 x10E3/uL   Lymphocytes Absolute 1.6 0.7 - 3.1 x10E3/uL   Monocytes Absolute 0.8 0.1 - 0.9 x10E3/uL   EOS (ABSOLUTE) 0.2 0.0 - 0.4 x10E3/uL   Basophils Absolute 0.0 0.0 - 0.2 x10E3/uL   Immature Granulocytes 0 %   Immature Grans (Abs) 0.0 0.0 - 0.1 x10E3/uL  Urinalysis, Routine w reflex microscopic (not at [facility])  Result Value Ref Range   Specific Gravity, UA 1.025 1.005 - 1.030   pH, UA 5.0 5.0 - 7.5   Color, UA Yellow Yellow   Appearance Ur Clear Clear   Leukocytes, UA Negative Negative   Protein, UA Trace Negative/Trace   Glucose, UA Negative Negative   Ketones, UA Negative Negative   RBC, UA Negative Negative   Bilirubin, UA Negative Negative   Urobilinogen, Ur 0.2 0.2 - 1.0 mg/dL   Nitrite, UA Negative Negative  TSH  Result Value Ref Range   TSH 1.780 0.450 - 4.500 uIU/mL  PSA  Result Value Ref Range   Prostate Specific Ag, Serum 2.7 0.0 - 4.0 ng/mL  Lipid panel  Result Value Ref Range   Cholesterol, Total 161 100 - 199 mg/dL   Triglycerides 914 (H) 0 - 149 mg/dL   HDL 31 (L) >78 mg/dL   VLDL Cholesterol Cal 32 5 - 40 mg/dL   LDL Calculated 98 0 - 99 mg/dL   Chol/HDL Ratio 5.2 (H) 0.0 - 5.0 ratio units      Assessment & Plan:   Problem List Items Addressed This Visit      Other   Symptomatic bradycardia -  Primary    Patient sat up from getting EKG and passed out. RBBB on EKG with HR at 52. Patient not feeling well. EMS called and he is to go to the hospital. Copy of this note given to them to bring with them.        Other Visit Diagnoses    Immunization due        Relevant Orders    Flu  Vaccine QUAD 36+ mos PF IM (Fluarix & Fluzone Quad PF) (Completed)    Dizziness        Relevant Orders    EKG 12-Lead (Completed)        Follow up plan: Return Following hospitalization.

## 2015-08-23 NOTE — Assessment & Plan Note (Signed)
Patient sat up from getting EKG and passed out. RBBB on EKG with HR at 52. Patient not feeling well. EMS called and he is to go to the hospital. Copy of this note given to them to bring with them.

## 2015-08-29 ENCOUNTER — Telehealth: Payer: Self-pay

## 2015-08-29 MED ORDER — TRAMADOL HCL 50 MG PO TABS: 50.0000 mg | ORAL_TABLET | Freq: Every day | ORAL | Status: DC | PRN

## 2015-08-29 NOTE — Telephone Encounter (Signed)
[facility] [address] requesting Tramadol  take 1-2 tab q6h prn

## 2015-10-17 ENCOUNTER — Telehealth: Payer: Self-pay

## 2015-10-17 ENCOUNTER — Other Ambulatory Visit: Payer: Self-pay | Admitting: Family Medicine

## 2015-10-17 DIAGNOSIS — F32A Depression, unspecified: Secondary | ICD-10-CM

## 2015-10-17 DIAGNOSIS — F329 Major depressive disorder, single episode, unspecified: Secondary | ICD-10-CM

## 2015-10-17 MED ORDER — LORAZEPAM 1 MG PO TABS: 1.0000 mg | ORAL_TABLET | Freq: Every day | ORAL | Status: DC | PRN

## 2015-10-17 NOTE — Telephone Encounter (Signed)
rx printed

## 2015-10-17 NOTE — Telephone Encounter (Signed)
[address] [facility] refill  Lorazepam 1mg  Tab BID #60

## 2015-12-27 ENCOUNTER — Ambulatory Visit: Payer: Medicare Other | Admitting: Family Medicine

## 2016-01-07 ENCOUNTER — Ambulatory Visit (INDEPENDENT_AMBULATORY_CARE_PROVIDER_SITE_OTHER): Payer: Medicare Other | Admitting: Family Medicine

## 2016-01-07 ENCOUNTER — Encounter: Payer: Self-pay | Admitting: Family Medicine

## 2016-01-07 VITALS — BP 148/68 | HR 56 | Temp 97.6°F | Ht 68.2 in | Wt 164.0 lb

## 2016-01-07 DIAGNOSIS — I10 Essential (primary) hypertension: Secondary | ICD-10-CM

## 2016-01-07 DIAGNOSIS — E785 Hyperlipidemia, unspecified: Secondary | ICD-10-CM | POA: Diagnosis not present

## 2016-01-07 DIAGNOSIS — I63239 Cerebral infarction due to unspecified occlusion or stenosis of unspecified carotid arteries: Secondary | ICD-10-CM | POA: Diagnosis not present

## 2016-01-07 DIAGNOSIS — F329 Major depressive disorder, single episode, unspecified: Secondary | ICD-10-CM | POA: Diagnosis not present

## 2016-01-07 DIAGNOSIS — I2583 Coronary atherosclerosis due to lipid rich plaque: Secondary | ICD-10-CM

## 2016-01-07 DIAGNOSIS — I2510 Atherosclerotic heart disease of native coronary artery without angina pectoris: Secondary | ICD-10-CM

## 2016-01-07 DIAGNOSIS — F32A Depression, unspecified: Secondary | ICD-10-CM

## 2016-01-07 LAB — LP+ALT+AST PICCOLO, WAIVED
ALT (SGPT) PICCOLO, WAIVED: 17 U/L (ref 10–47)
AST (SGOT) PICCOLO, WAIVED: 35 U/L (ref 11–38)
Chol/HDL Ratio Piccolo,Waive: 4.5 mg/dL
Cholesterol Piccolo, Waived: 127 mg/dL (ref <200)
HDL CHOL PICCOLO, WAIVED: 28 mg/dL — AB (ref >59)
LDL Chol Calc Piccolo Waived: 67 mg/dL (ref <100)
Triglycerides Piccolo,Waived: 155 mg/dL — ABNORMAL HIGH (ref <150)
VLDL Chol Calc Piccolo,Waive: 31 mg/dL — ABNORMAL HIGH (ref <30)

## 2016-01-07 MED ORDER — CITALOPRAM HYDROBROMIDE 40 MG PO TABS: 40.0000 mg | ORAL_TABLET | Freq: Every day | ORAL | Status: AC

## 2016-01-07 MED ORDER — LORAZEPAM 1 MG PO TABS: 1.0000 mg | ORAL_TABLET | Freq: Every day | ORAL | Status: AC | PRN

## 2016-01-07 MED ORDER — SIMVASTATIN 80 MG PO TABS: 80.0000 mg | ORAL_TABLET | Freq: Every day | ORAL | Status: AC

## 2016-01-07 MED ORDER — TRAMADOL HCL 50 MG PO TABS: 50.0000 mg | ORAL_TABLET | Freq: Every day | ORAL | Status: AC | PRN

## 2016-01-07 MED ORDER — TRAZODONE HCL 50 MG PO TABS: 50.0000 mg | ORAL_TABLET | Freq: Every day | ORAL | Status: AC

## 2016-01-07 NOTE — Assessment & Plan Note (Signed)
The current medical regimen is effective;  continue present plan and medications.  

## 2016-01-07 NOTE — Progress Notes (Signed)
BP 148/68 mmHg  Pulse 56  Temp(Src) 97.6 F (36.4 C)  Ht 5' 8.2" (1.732 m)  Wt 164 lb (74.39 kg)  BMI 24.80 kg/m2  SpO2 97%   Subjective:    Patient ID: Andre Griffin, male    DOB: Sep 06, 1941, 75 y.o.   MRN: [id number]  HPI: Andre Griffin is a 75 y.o. male  Chief Complaint  Patient presents with  . Hypertension  . Hyperlipidemia  . transfer meds from [place] if possible  . Cardiology appointment [facility]    Feb. 22, 2017    follow-up Loraine Leriche carotid stenosis said as had right side repaired left side pending for later this year On chart review no coronary artery disease Agent with no complaints except for a little numbness around the surgical scar site no change in his medications has been doing well otherwise  Relevant past medical, surgical, family and social history reviewed and updated as indicated. Interim medical history since our last visit reviewed. Allergies and medications reviewed and updated.  Review of Systems  Constitutional: Negative.   Respiratory: Negative.   Cardiovascular: Negative.     Per HPI unless specifically indicated above     Objective:    BP 148/68 mmHg  Pulse 56  Temp(Src) 97.6 F (36.4 C)  Ht 5' 8.2" (1.732 m)  Wt 164 lb (74.39 kg)  BMI 24.80 kg/m2  SpO2 97%  Wt Readings from Last 3 Encounters:  01/07/16 164 lb (74.39 kg)  08/23/15 170 lb (77.111 kg)  06/25/15 165 lb (74.844 kg)    Physical Exam  Constitutional: He is oriented to person, place, and time. He appears well-developed and well-nourished. No distress.  HENT:  Head: Normocephalic and atraumatic.  Right Ear: Hearing normal.  Left Ear: Hearing normal.  Nose: Nose normal.  Eyes: Conjunctivae and lids are normal. Right eye exhibits no discharge. Left eye exhibits no discharge. No scleral icterus.  Cardiovascular: Normal rate, regular rhythm and normal heart sounds.   Pulmonary/Chest: Effort normal and breath sounds normal. No respiratory distress.  Musculoskeletal:  Normal range of motion.  Neurological: He is alert and oriented to person, place, and time.  Skin: Skin is intact. No rash noted.  Psychiatric: He has a normal mood and affect. His speech is normal and behavior is normal. Judgment and thought content normal. Cognition and memory are normal.    Results for orders placed or performed in visit on 06/25/15  Comprehensive metabolic panel  Result Value Ref Range   Glucose 83 65 - 99 mg/dL   BUN 13 8 - 27 mg/dL   Creatinine, Ser 4.09 (H) 0.76 - 1.27 mg/dL   GFR calc non Af Amer 46 (L) >59 mL/min/1.73   GFR calc Af Amer 53 (L) >59 mL/min/1.73   BUN/Creatinine Ratio 9 (L) 10 - 22   Sodium 145 (H) 134 - 144 mmol/L   Potassium 3.9 3.5 - 5.2 mmol/L   Chloride 105 97 - 108 mmol/L   CO2 23 18 - 29 mmol/L   Calcium 8.8 8.6 - 10.2 mg/dL   Total Protein 6.9 6.0 - 8.5 g/dL   Albumin 4.0 3.5 - 4.8 g/dL   Globulin, Total 2.9 1.5 - 4.5 g/dL   Albumin/Globulin Ratio 1.4 1.1 - 2.5   Bilirubin Total 0.3 0.0 - 1.2 mg/dL   Alkaline Phosphatase 80 39 - 117 IU/L   AST 20 0 - 40 IU/L   ALT 17 0 - 44 IU/L  CBC with Differential/Platelet  Result Value Ref  Range   WBC 6.9 3.4 - 10.8 x10E3/uL   RBC 4.34 4.14 - 5.80 x10E6/uL   Hemoglobin 13.5 12.6 - 17.7 g/dL   Hematocrit 29.5 62.1 - 51.0 %   MCV 93 79 - 97 fL   MCH 31.1 26.6 - 33.0 pg   MCHC 33.6 31.5 - 35.7 g/dL   RDW 30.8 65.7 - 84.6 %   Platelets 168 150 - 379 x10E3/uL   Neutrophils 63 %   Lymphs 23 %   Monocytes 12 %   Eos 2 %   Basos 0 %   Neutrophils Absolute 4.3 1.4 - 7.0 x10E3/uL   Lymphocytes Absolute 1.6 0.7 - 3.1 x10E3/uL   Monocytes Absolute 0.8 0.1 - 0.9 x10E3/uL   EOS (ABSOLUTE) 0.2 0.0 - 0.4 x10E3/uL   Basophils Absolute 0.0 0.0 - 0.2 x10E3/uL   Immature Granulocytes 0 %   Immature Grans (Abs) 0.0 0.0 - 0.1 x10E3/uL  Urinalysis, Routine w reflex microscopic (not at [facility])  Result Value Ref Range   Specific Gravity, UA 1.025 1.005 - 1.030   pH, UA 5.0 5.0 - 7.5   Color, UA Yellow  Yellow   Appearance Ur Clear Clear   Leukocytes, UA Negative Negative   Protein, UA Trace Negative/Trace   Glucose, UA Negative Negative   Ketones, UA Negative Negative   RBC, UA Negative Negative   Bilirubin, UA Negative Negative   Urobilinogen, Ur 0.2 0.2 - 1.0 mg/dL   Nitrite, UA Negative Negative  TSH  Result Value Ref Range   TSH 1.780 0.450 - 4.500 uIU/mL  PSA  Result Value Ref Range   Prostate Specific Ag, Serum 2.7 0.0 - 4.0 ng/mL  Lipid panel  Result Value Ref Range   Cholesterol, Total 161 100 - 199 mg/dL   Triglycerides 962 (H) 0 - 149 mg/dL   HDL 31 (L) >95 mg/dL   VLDL Cholesterol Cal 32 5 - 40 mg/dL   LDL Calculated 98 0 - 99 mg/dL   Chol/HDL Ratio 5.2 (H) 0.0 - 5.0 ratio units      Assessment & Plan:   Problem List Items Addressed This Visit      Cardiovascular and Mediastinum   Symptomatic carotid artery stenosis with infarction [facility])    Surgery done in October which resolved the numbness in his arm no further TIA symptoms since surgery Followed at [facility]      Relevant Medications   simvastatin (ZOCOR) 80 MG tablet   Hypertension    The current medical regimen is effective;  continue present plan and medications.       Relevant Medications   simvastatin (ZOCOR) 80 MG tablet   RESOLVED: CAD (coronary artery disease)   Relevant Medications   simvastatin (ZOCOR) 80 MG tablet     Other   Hyperlipidemia   Relevant Medications   simvastatin (ZOCOR) 80 MG tablet   Other Relevant Orders   LP+ALT+AST Piccolo, Waived   Basic metabolic panel   Depression    The current medical regimen is effective;  continue present plan and medications.       Relevant Medications   citalopram (CELEXA) 40 MG tablet   traZODone (DESYREL) 50 MG tablet   LORazepam (ATIVAN) 1 MG tablet    Other Visit Diagnoses    Essential hypertension, benign    -  Primary    Relevant Medications    simvastatin (ZOCOR) 80 MG tablet    Other Relevant Orders    LP+ALT+AST Piccolo,  [employer]    Basic  metabolic panel        Follow up plan: Return in about 6 months (around 07/06/2016), or if symptoms worsen or fail to improve, for Physical Exam.

## 2016-01-07 NOTE — Assessment & Plan Note (Signed)
Surgery done in October which resolved the numbness in his arm no further TIA symptoms since surgery Followed at [facility] [place] [facility]

## 2016-01-08 ENCOUNTER — Encounter: Payer: Self-pay | Admitting: Family Medicine

## 2016-01-08 LAB — BASIC METABOLIC PANEL
BUN/Creatinine Ratio: 10 (ref 10–22)
BUN: 16 mg/dL (ref 8–27)
CO2: 22 mmol/L (ref 18–29)
Calcium: 9.1 mg/dL (ref 8.6–10.2)
Chloride: 102 mmol/L (ref 96–106)
Creatinine, Ser: 1.53 mg/dL — ABNORMAL HIGH (ref 0.76–1.27)
GFR calc Af Amer: 51 mL/min/{1.73_m2} — ABNORMAL LOW (ref >59)
GFR, EST NON AFRICAN AMERICAN: 44 mL/min/{1.73_m2} — AB (ref >59)
Glucose: 137 mg/dL — ABNORMAL HIGH (ref 65–99)
Potassium: 4.1 mmol/L (ref 3.5–5.2)
Sodium: 140 mmol/L (ref 134–144)

## 2016-01-28 ENCOUNTER — Telehealth: Payer: Self-pay | Admitting: Family Medicine

## 2016-02-06 NOTE — Telephone Encounter (Signed)
Pt's son Andre Griffin came by to inform that pt is at [facility], pt had a massive stroke. They will be removing life support soon. Please call pt's son Andre Griffin with any questions. Thanks.   Pt is in Neuro ICU @ [facility] (2nd floor Neuro hospital)

## 2016-02-06 DEATH — deceased

## 2016-07-14 ENCOUNTER — Encounter: Payer: Medicare Other | Admitting: Family Medicine
